# Patient Record
Sex: Male | Born: 1990 | Race: White | Hispanic: No | Smoking: Current every day smoker
Health system: Southern US, Community
[De-identification: ages and names within clinical notes are randomized; demographics above are authoritative.]

## PROBLEM LIST (undated history)

## (undated) HISTORY — PX: FINGER SURGERY: SHX640

---
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
## 2011-01-22 NOTE — Op Note (Signed)
NAMEJOEANTHONY, SEELING NO.:  [id number]   MEDICAL RECORD NO.:  [id number]          PATIENT TYPE:  INP   LOCATION:  2550                         FACILITY:  [facility]   PHYSICIAN:  Madelynn Done, MD  DATE OF BIRTH:  [birth year]   DATE OF PROCEDURE:  02/12/2009  DATE OF DISCHARGE:  02/12/2009                               OPERATIVE REPORT   PREOPERATIVE DIAGNOSIS:  Right long finger laceration with tendon and  nerve involvement.   POSTOPERATIVE DIAGNOSIS:  Right long finger laceration with tendon and  nerve involvement.   ATTENDING PHYSICIAN:  Madelynn Done, MD who was scrubbed and present  for the entire procedure.   ASSISTANT SURGEON:  None.   SURGICAL PROCEDURES:  1. Right long finger zone II flexor digitorum profundus tendon repair.  2. Right long finger flexor digitorum superficialis tendon repair.  3. Right long finger radial digital nerve repair.  4. Traumatic laceration 3 cm right long finger repair.   ANESTHESIA:  General via endotracheal tube.   TOURNIQUET TIME:  54 minutes with 250 mmHg.   INTRAOPERATIVE FINDINGS:  The patient had a complete laceration to the  FDP, the ulnar digital nerve was in continuity, the radial digital nerve  was not in continuity.  The patient did have 60% laceration to the FDS,  the entire ulnar slip of the FDS was lacerated, there was approximately  20% laceration in the radial slip to the FDS.   SURGICAL INDICATIONS:  Mr. Russett is an 20 year old gentleman who was at  home using a knife, the knife slipped and cut him at his nondominant  right long finger.  The patient was seen and evaluated in the emergency  department and was recommended to undergo the above procedure.  Risks,  benefits, and alternatives were discussed in detail with the patient and  a signed informed consent was obtained.  Risks include but are not  limited to bleeding, infection, damage to nearby nerves, arteries and  tendons, tendon rupture, loss  of motion in digits and need for further  surgical intervention.   DESCRIPTION OF PROCEDURE:  The patient was appropriately identified in  the preoperative holding area and mark with a permanent marker was made  on the right long finger indicating the correct operative site.  The  patient then brought back to the operating room, placed supine on  anesthesia table and general anesthesia was administered.  The patient  tolerated this well.  He received preoperative antibiotics prior to any  skin incisions.  A well-padded tourniquet was then placed on the right  brachium and sealed with a 1000 drape.  Right upper extremity was then  prepped with Hibiclens and sterilely draped.  A time-out was called,  correct side was identified and procedure was then begun.  Attention was  then turned to the right long finger where the limb was then elevated  using Esmarch exsanguination and tourniquet insufflated.  The oblique  laceration was then carried out proximally and extended proximally and  distally.  Skin flaps were then raised.  Careful attention was  maintained with continuity in the ulnar neurovascular bundle.  The  radial digital nerve was then sharply lacerated from its injury.  Dissection was carried out into the flexor tendon sheath.  The FDS and  FDP were both identified.  This was at the level of the A3 pulley just  in between the A3 and A4 pulley, A4 pulley was in continuity.  Following  this attention was then turned to repair the FDS.  The ulnar slip of the  FDS was then repaired with a 4 strand core horizontal mattress sutures  supplemented by 6-0 Prolene running epitendinous suture.  The radial  slip which had a 20% laceration was then repaired with a 4-0 core  FiberWire suture supplemented with a 6-0 Prolene epitendinous suture.  This reapproximated the tendon edges nicely.  Attention was then turned  to retrieval of the FDP.  The FDP was within the wound and not retracted   significantly.  The FDS was advanced, the FDP was then repaired using a  6 strand core suture repair or the modified Kessler 2 horizontal  mattress core sutures.  Following this, it was supplemented with a 6-0  epitendinous suture.  There was good repair across the laceration site.  Finally, attention was then turned to the radial digital nerve where the  nerve ends were then dissected carefully freeing.  The nerve ends were  clearly and sharply lacerated.  They were taken back to healthy nerve  fascicles sharply and then 9-0 epitendinous suture repair was then done  under loupe magnification.  There was a good reapproximation of the  nerve ends.  Following this the wound was then thoroughly irrigated.  The tourniquet was deflated.  Skin was then closed with simple 4-0  Prolene sutures.  Marcaine 0.25% 8 mL were then infiltrated flexor  tendon sheath block.  Adaptic dressing and sterile compressive bandages  were then applied.  The patient was then placed in a well-padded dorsal  blocking splint keeping the fingers in slight flexed position and the  wrist flexed.  He was then extubated and taken to recovery room in good  condition.   POSTOPERATIVE PLAN:  The patient will be discharged home and seen back  in the office in 5 days for wound check and begin a zone II flexor  tendon rehab protocol.      Madelynn Done, MD  Electronically Signed     FWO/MEDQ  D:  02/12/2009  T:  02/13/2009  Job:  [id number]

## 2012-04-17 ENCOUNTER — Encounter (HOSPITAL_BASED_OUTPATIENT_CLINIC_OR_DEPARTMENT_OTHER): Payer: Self-pay | Admitting: *Deleted

## 2012-04-17 ENCOUNTER — Emergency Department (HOSPITAL_BASED_OUTPATIENT_CLINIC_OR_DEPARTMENT_OTHER)
Admission: EM | Admit: 2012-04-17 | Discharge: 2012-04-17 | Disposition: A | Discharge status: Home / Self Care | Payer: Self-pay | Attending: Emergency Medicine | Admitting: Emergency Medicine

## 2012-04-17 DIAGNOSIS — Z789 Other specified health status: Secondary | ICD-10-CM

## 2012-04-17 DIAGNOSIS — F1010 Alcohol abuse, uncomplicated: Secondary | ICD-10-CM | POA: Insufficient documentation

## 2012-04-17 DIAGNOSIS — F17200 Nicotine dependence, unspecified, uncomplicated: Secondary | ICD-10-CM | POA: Insufficient documentation

## 2012-04-17 NOTE — ED Provider Notes (Signed)
History     CSN: [id number]  Arrival date & time 04/17/12  0719   First MD Initiated Contact with Patient 04/17/12 [phone]      Chief Complaint  Patient presents with  . Alcohol Intoxication    (Consider location/radiation/quality/duration/timing/severity/associated sxs/prior treatment) HPI This 21 year old male drinks beers his friends last night and walked home, as he was walking home he got tired and laid down on the side of the road to take a nap. EMS was notified and found him sleeping initially and had to wake him up with painful stimulus. Once the patient was awake he is now awake alert nontoxic with normal speech and gait and denying any complaints at all. He denies any trauma. Denies any threats or himself or others. He denies any headache neck pain back pain chest pain shortness breath abdominal pain weakness numbness or other concerns. He feels fine and just wants to call his sister for ride home. History reviewed. No pertinent past medical history.  Past Surgical History  Procedure Date  . Finger surgery     No family history on file.  History  Substance Use Topics  . Smoking status: Current Everyday Smoker -- 1.0 packs/day    Types: Cigarettes  . Smokeless tobacco: Not on file  . Alcohol Use: 3.0 oz/week    5 Cans of beer per week     daily      Review of Systems 10 Systems reviewed and are negative for acute change except as noted in the HPI. Allergies  Review of patient's allergies indicates no known allergies.  Home Medications  No current outpatient prescriptions on file.  BP 119/75  Pulse 88  Temp 98.3 F (36.8 C) (Oral)  Resp 20  Ht 5\' 11"  (1.803 m)  Wt 160 lb (72.576 kg)  BMI 22.32 kg/m2  SpO2 100%  Physical Exam  Nursing note and vitals reviewed. Constitutional: He is oriented to person, place, and time.       Awake, alert, nontoxic appearance with baseline speech for patient.  HENT:  Head: Atraumatic.  Mouth/Throat: Oropharynx is clear  and moist. No oropharyngeal exudate.  Eyes: EOM are normal. Pupils are equal, round, and reactive to light. Right eye exhibits no discharge. Left eye exhibits no discharge.  Neck: Neck supple.  Cardiovascular: Normal rate and regular rhythm.   No murmur heard. Pulmonary/Chest: Effort normal and breath sounds normal. No stridor. No respiratory distress. He has no wheezes. He has no rales. He exhibits no tenderness.  Abdominal: Soft. Bowel sounds are normal. He exhibits no mass. There is no tenderness. There is no rebound.  Musculoskeletal: He exhibits no tenderness.       Baseline ROM, moves extremities with no obvious new focal weakness.  Lymphadenopathy:    He has no cervical adenopathy.  Neurological: He is alert and oriented to person, place, and time.       Awake, alert, cooperative and aware of situation; motor strength bilaterally; sensation normal to light touch bilaterally; peripheral visual fields full to confrontation; no facial asymmetry; tongue midline; major cranial nerves appear intact; no pronator drift, normal finger to nose bilaterally, baseline gait without new ataxia.  Skin: No rash noted.  Psychiatric: He has a normal mood and affect.    ED Course  Procedures (including critical care time)  Labs Reviewed - No data to display No results found.   1. Alcohol ingestion       MDM  Doubt intentional OD, SI, or ongoing clinical intoxication.  Hurman Horn, MD 04/18/12 [phone]

## 2012-04-17 NOTE — ED Notes (Signed)
EMS found on side of road, unconscious except to painful stimulation with a case of beer beside him.  VSS.  Patient is now awake, alert and oriented x 4.  Denies pain.  MOE x 4.  Full immobilized.

## 2020-06-11 ENCOUNTER — Emergency Department (HOSPITAL_BASED_OUTPATIENT_CLINIC_OR_DEPARTMENT_OTHER): Payer: Self-pay

## 2020-06-11 ENCOUNTER — Other Ambulatory Visit: Payer: Self-pay

## 2020-06-11 ENCOUNTER — Emergency Department (HOSPITAL_BASED_OUTPATIENT_CLINIC_OR_DEPARTMENT_OTHER)
Admission: EM | Admit: 2020-06-11 | Discharge: 2020-06-11 | Disposition: A | Discharge status: Home / Self Care | Payer: Self-pay | Attending: Emergency Medicine | Admitting: Emergency Medicine

## 2020-06-11 ENCOUNTER — Encounter (HOSPITAL_BASED_OUTPATIENT_CLINIC_OR_DEPARTMENT_OTHER): Payer: Self-pay | Admitting: Emergency Medicine

## 2020-06-11 DIAGNOSIS — Z23 Encounter for immunization: Secondary | ICD-10-CM | POA: Insufficient documentation

## 2020-06-11 DIAGNOSIS — R10819 Abdominal tenderness, unspecified site: Secondary | ICD-10-CM | POA: Insufficient documentation

## 2020-06-11 DIAGNOSIS — Y92410 Unspecified street and highway as the place of occurrence of the external cause: Secondary | ICD-10-CM | POA: Insufficient documentation

## 2020-06-11 DIAGNOSIS — S92331A Displaced fracture of third metatarsal bone, right foot, initial encounter for closed fracture: Secondary | ICD-10-CM

## 2020-06-11 DIAGNOSIS — S0101XA Laceration without foreign body of scalp, initial encounter: Secondary | ICD-10-CM | POA: Insufficient documentation

## 2020-06-11 DIAGNOSIS — S92341A Displaced fracture of fourth metatarsal bone, right foot, initial encounter for closed fracture: Secondary | ICD-10-CM

## 2020-06-11 DIAGNOSIS — S82831A Other fracture of upper and lower end of right fibula, initial encounter for closed fracture: Secondary | ICD-10-CM

## 2020-06-11 DIAGNOSIS — Y9389 Activity, other specified: Secondary | ICD-10-CM | POA: Insufficient documentation

## 2020-06-11 DIAGNOSIS — S20211A Contusion of right front wall of thorax, initial encounter: Secondary | ICD-10-CM | POA: Insufficient documentation

## 2020-06-11 DIAGNOSIS — F17210 Nicotine dependence, cigarettes, uncomplicated: Secondary | ICD-10-CM | POA: Insufficient documentation

## 2020-06-11 LAB — COMPREHENSIVE METABOLIC PANEL
ALT: 54 U/L — ABNORMAL HIGH (ref 0–44)
AST: 108 U/L — ABNORMAL HIGH (ref 15–41)
Albumin: 4.5 g/dL (ref 3.5–5.0)
Alkaline Phosphatase: 43 U/L (ref 38–126)
Anion gap: 11 (ref 5–15)
BUN: 11 mg/dL (ref 6–20)
CO2: 25 mmol/L (ref 22–32)
Calcium: 9.1 mg/dL (ref 8.9–10.3)
Chloride: 102 mmol/L (ref 98–111)
Creatinine, Ser: 0.61 mg/dL (ref 0.61–1.24)
GFR calc Af Amer: >60 mL/min (ref >60)
GFR calc non Af Amer: >60 mL/min (ref >60)
Glucose, Bld: 115 mg/dL — ABNORMAL HIGH (ref 70–99)
Potassium: 4 mmol/L (ref 3.5–5.1)
Sodium: 138 mmol/L (ref 135–145)
Total Bilirubin: 0.7 mg/dL (ref 0.3–1.2)
Total Protein: 7.1 g/dL (ref 6.5–8.1)

## 2020-06-11 LAB — CBC
HCT: 40.6 % (ref 39.0–52.0)
Hemoglobin: 14 g/dL (ref 13.0–17.0)
MCH: 32.6 pg (ref 26.0–34.0)
MCHC: 34.5 g/dL (ref 30.0–36.0)
MCV: 94.4 fL (ref 80.0–100.0)
Platelets: 229 10*3/uL (ref 150–400)
RBC: 4.3 MIL/uL (ref 4.22–5.81)
RDW: 11.9 % (ref 11.5–15.5)
WBC: 12.2 10*3/uL — ABNORMAL HIGH (ref 4.0–10.5)
nRBC: 0 % (ref 0.0–0.2)

## 2020-06-11 LAB — PROTIME-INR
INR: 1 (ref 0.8–1.2)
Prothrombin Time: 13 seconds (ref 11.4–15.2)

## 2020-06-11 LAB — LACTIC ACID, PLASMA: Lactic Acid, Venous: 1.8 mmol/L (ref 0.5–1.9)

## 2020-06-11 MED ORDER — TETANUS-DIPHTH-ACELL PERTUSSIS 5-2.5-18.5 LF-MCG/0.5 IM SUSP
0.5000 mL | Freq: Once | INTRAMUSCULAR | Status: AC
Start: 2020-06-11 — End: 2020-06-11
  Administered 2020-06-11: 0.5 mL via INTRAMUSCULAR
  Filled 2020-06-11: qty 0.5

## 2020-06-11 MED ORDER — MORPHINE SULFATE (PF) 4 MG/ML IV SOLN
4.0000 mg | Freq: Once | INTRAVENOUS | Status: AC
  Administered 2020-06-11: 4 mg via INTRAVENOUS
  Filled 2020-06-11: qty 1

## 2020-06-11 MED ORDER — SODIUM CHLORIDE 0.9 % IV SOLN: INTRAVENOUS | Status: DC

## 2020-06-11 MED ORDER — IOHEXOL 300 MG/ML  SOLN
100.0000 mL | Freq: Once | INTRAMUSCULAR | Status: AC | PRN
  Administered 2020-06-11: 100 mL via INTRAVENOUS

## 2020-06-11 MED ORDER — SODIUM CHLORIDE 0.9 % IV BOLUS
1000.0000 mL | Freq: Once | INTRAVENOUS | Status: AC
  Administered 2020-06-11: 1000 mL via INTRAVENOUS

## 2020-06-11 MED ORDER — HYDROCODONE-ACETAMINOPHEN 5-325 MG PO TABS: 1.0000 | ORAL_TABLET | ORAL | 0 refills | Status: DC | PRN

## 2020-06-11 MED ORDER — ONDANSETRON HCL 4 MG/2ML IJ SOLN
4.0000 mg | Freq: Once | INTRAMUSCULAR | Status: AC
  Administered 2020-06-11: 4 mg via INTRAVENOUS
  Filled 2020-06-11: qty 2

## 2020-06-11 MED ORDER — CYCLOBENZAPRINE HCL 10 MG PO TABS: 10.0000 mg | ORAL_TABLET | Freq: Two times a day (BID) | ORAL | 0 refills | Status: AC | PRN

## 2020-06-11 NOTE — ED Notes (Signed)
Resting quietly, appears comfortable at this time.
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
## 2020-06-11 NOTE — ED Notes (Signed)
RLE/ Ankle/ Foot splint applied by EMT, cast/ splint instructions reviewed with pt by RN. Crutch teaching also provided. Good Capillary Refill, WNL noted post splint cast application of rt toes. Able to move toes well, also denies any numbness or tingling of rt toes as well. Copy of AVS provided to pt as well

## 2020-06-11 NOTE — Discharge Instructions (Addendum)
You need to follow-up with orthopedic surgery within the week.  Their contact numbers listed on your discharge paperwork.  Do not attempt to walk on your foot.  Please keep the splint dry.  Staples need to be removed in 7 to 10 days  Take the pain medicine as prescribed.  Return for new or worsening symptoms.

## 2020-06-11 NOTE — ED Notes (Signed)
In at bedside to clean up scalp area, medical cleansing spray utilized, tolerated very well
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
## 2020-06-11 NOTE — ED Notes (Signed)
Patient transported to CT 

## 2020-06-11 NOTE — ED Notes (Signed)
Pt teaching also provided on wound care of abrasions and head lac where staples have been placed. Discussed with significant other when there would be a need to return to the ED. Pt escorted to POV via wc and assisted into POV

## 2020-06-11 NOTE — ED Notes (Signed)
Medicated per ED PA for manipulantion and splinting of Rt ankle area.

## 2020-06-11 NOTE — ED Notes (Signed)
A = WNL B = WNL C = bruising, abrasions, good pulses noted, bleeding is controlled D = GCS 15, RASS 0, WNL E = as noted in previous note F = vital signs as noted G = on cardiac monitor, position of comfort H = performed with ED PA I = clear, no injuries or discoloration noted
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
## 2020-06-11 NOTE — ED Notes (Addendum)
Parietal area of scalp, 2 lacerations noted: 1cm straight, 2cm crecent shaped, bleeding controlled Rt hand appears swollen and is tender to touch Rt side of abdomen has large discoloration with abrasion noted Rt hip has large bruise area Rt knee, lateral aspect has abrasion noted as well Rt foot, very swollen, bruising medial and lateral, very tender to touch Large abrasion noted over left eye also noted

## 2020-06-16 ENCOUNTER — Encounter (HOSPITAL_BASED_OUTPATIENT_CLINIC_OR_DEPARTMENT_OTHER): Payer: Self-pay | Admitting: Orthopedic Surgery

## 2020-06-16 ENCOUNTER — Other Ambulatory Visit: Payer: Self-pay

## 2020-06-20 ENCOUNTER — Other Ambulatory Visit (HOSPITAL_COMMUNITY)
Admission: RE | Admit: 2020-06-20 | Discharge: 2020-06-20 | Disposition: A | Discharge status: Home / Self Care | Payer: HRSA Program | Source: Ambulatory Visit | Attending: Orthopedic Surgery | Admitting: Orthopedic Surgery

## 2020-06-20 DIAGNOSIS — Z20822 Contact with and (suspected) exposure to covid-19: Secondary | ICD-10-CM | POA: Insufficient documentation

## 2020-06-20 DIAGNOSIS — Z01812 Encounter for preprocedural laboratory examination: Secondary | ICD-10-CM | POA: Insufficient documentation

## 2020-06-20 LAB — SARS CORONAVIRUS 2 (TAT 6-24 HRS): SARS Coronavirus 2: NEGATIVE

## 2020-06-20 MED ORDER — IOHEXOL 350 MG/ML SOLN
INTRAVENOUS | Status: AC
  Filled 2020-06-20: qty 1

## 2020-06-20 MED ORDER — LIDOCAINE HCL (PF) 1 % IJ SOLN
INTRAMUSCULAR | Status: AC
  Filled 2020-06-20: qty 30

## 2020-06-20 MED ORDER — HEPARIN (PORCINE) IN NACL 1000-0.9 UT/500ML-% IV SOLN
INTRAVENOUS | Status: AC
  Filled 2020-06-20: qty 1000

## 2020-06-20 MED ORDER — NITROGLYCERIN 1 MG/10 ML FOR IR/CATH LAB
INTRA_ARTERIAL | Status: AC
  Filled 2020-06-20: qty 10
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
## 2020-06-23 ENCOUNTER — Ambulatory Visit (HOSPITAL_BASED_OUTPATIENT_CLINIC_OR_DEPARTMENT_OTHER)
Admission: RE | Admit: 2020-06-23 | Discharge: 2020-06-23 | Disposition: A | Discharge status: Home / Self Care | Payer: Self-pay | Attending: Orthopedic Surgery | Admitting: Orthopedic Surgery

## 2020-06-23 ENCOUNTER — Other Ambulatory Visit: Payer: Self-pay

## 2020-06-23 ENCOUNTER — Encounter (HOSPITAL_BASED_OUTPATIENT_CLINIC_OR_DEPARTMENT_OTHER): Payer: Self-pay | Admitting: Orthopedic Surgery

## 2020-06-23 ENCOUNTER — Ambulatory Visit (HOSPITAL_BASED_OUTPATIENT_CLINIC_OR_DEPARTMENT_OTHER): Payer: Self-pay | Admitting: Certified Registered"

## 2020-06-23 ENCOUNTER — Encounter (HOSPITAL_BASED_OUTPATIENT_CLINIC_OR_DEPARTMENT_OTHER)
Admission: RE | Disposition: A | Discharge status: Home / Self Care | Payer: Self-pay | Source: Home / Self Care | Attending: Orthopedic Surgery

## 2020-06-23 DIAGNOSIS — S93401A Sprain of unspecified ligament of right ankle, initial encounter: Secondary | ICD-10-CM | POA: Insufficient documentation

## 2020-06-23 DIAGNOSIS — S82851A Displaced trimalleolar fracture of right lower leg, initial encounter for closed fracture: Secondary | ICD-10-CM | POA: Insufficient documentation

## 2020-06-23 DIAGNOSIS — F17200 Nicotine dependence, unspecified, uncomplicated: Secondary | ICD-10-CM | POA: Insufficient documentation

## 2020-06-23 HISTORY — PX: ORIF ANKLE FRACTURE: SHX5408

## 2020-06-23 SURGERY — OPEN REDUCTION INTERNAL FIXATION (ORIF) ANKLE FRACTURE
Anesthesia: Regional | Site: Ankle | Laterality: Right

## 2020-06-23 MED ORDER — FENTANYL CITRATE (PF) 100 MCG/2ML IJ SOLN
INTRAMUSCULAR | Status: AC
  Filled 2020-06-23: qty 2

## 2020-06-23 MED ORDER — FENTANYL CITRATE (PF) 100 MCG/2ML IJ SOLN
INTRAMUSCULAR | Status: DC | PRN
Start: 2020-06-23 — End: 2020-06-23
  Administered 2020-06-23: 25 ug via INTRAVENOUS

## 2020-06-23 MED ORDER — FENTANYL CITRATE (PF) 100 MCG/2ML IJ SOLN
100.0000 ug | Freq: Once | INTRAMUSCULAR | Status: AC
  Administered 2020-06-23: 50 ug via INTRAVENOUS

## 2020-06-23 MED ORDER — OXYCODONE HCL 5 MG PO TABS: 10.0000 mg | ORAL_TABLET | ORAL | Status: DC | PRN

## 2020-06-23 MED ORDER — LIDOCAINE HCL (CARDIAC) PF 100 MG/5ML IV SOSY
PREFILLED_SYRINGE | INTRAVENOUS | Status: DC | PRN
  Administered 2020-06-23: 60 mg via INTRAVENOUS

## 2020-06-23 MED ORDER — CEFAZOLIN SODIUM-DEXTROSE 2-4 GM/100ML-% IV SOLN
INTRAVENOUS | Status: AC
  Filled 2020-06-23: qty 100

## 2020-06-23 MED ORDER — PHENYLEPHRINE HCL (PRESSORS) 10 MG/ML IV SOLN
INTRAVENOUS | Status: DC | PRN
  Administered 2020-06-23: 40 ug via INTRAVENOUS

## 2020-06-23 MED ORDER — ONDANSETRON HCL 4 MG/2ML IJ SOLN
INTRAMUSCULAR | Status: DC | PRN
  Administered 2020-06-23: 4 mg via INTRAVENOUS

## 2020-06-23 MED ORDER — DEXAMETHASONE SODIUM PHOSPHATE 10 MG/ML IJ SOLN
INTRAMUSCULAR | Status: DC | PRN
  Administered 2020-06-23: 5 mg via INTRAVENOUS

## 2020-06-23 MED ORDER — BISACODYL 10 MG RE SUPP: 10.0000 mg | Freq: Every day | RECTAL | Status: DC | PRN

## 2020-06-23 MED ORDER — OXYCODONE HCL 5 MG PO TABS: 5.0000 mg | ORAL_TABLET | ORAL | Status: DC | PRN

## 2020-06-23 MED ORDER — ACETAMINOPHEN 325 MG PO TABS: 325.0000 mg | ORAL_TABLET | Freq: Four times a day (QID) | ORAL | Status: DC | PRN

## 2020-06-23 MED ORDER — ONDANSETRON HCL 4 MG PO TABS: 4.0000 mg | ORAL_TABLET | Freq: Four times a day (QID) | ORAL | Status: DC | PRN

## 2020-06-23 MED ORDER — METOCLOPRAMIDE HCL 5 MG PO TABS: 5.0000 mg | ORAL_TABLET | Freq: Three times a day (TID) | ORAL | Status: DC | PRN

## 2020-06-23 MED ORDER — OXYCODONE HCL 5 MG/5ML PO SOLN: 5.0000 mg | Freq: Once | ORAL | Status: DC | PRN

## 2020-06-23 MED ORDER — ONDANSETRON HCL 4 MG/2ML IJ SOLN: 4.0000 mg | Freq: Four times a day (QID) | INTRAMUSCULAR | Status: DC | PRN

## 2020-06-23 MED ORDER — METOCLOPRAMIDE HCL 5 MG/ML IJ SOLN: 5.0000 mg | Freq: Three times a day (TID) | INTRAMUSCULAR | Status: DC | PRN

## 2020-06-23 MED ORDER — OXYCODONE HCL 5 MG PO TABS: 5.0000 mg | ORAL_TABLET | Freq: Once | ORAL | Status: DC | PRN

## 2020-06-23 MED ORDER — ONDANSETRON HCL 4 MG/2ML IJ SOLN: 4.0000 mg | Freq: Once | INTRAMUSCULAR | Status: DC | PRN

## 2020-06-23 MED ORDER — ACETAMINOPHEN 325 MG PO TABS: 325.0000 mg | ORAL_TABLET | ORAL | Status: DC | PRN

## 2020-06-23 MED ORDER — HYDROMORPHONE HCL 1 MG/ML IJ SOLN: 0.5000 mg | INTRAMUSCULAR | Status: DC | PRN

## 2020-06-23 MED ORDER — CEFAZOLIN SODIUM-DEXTROSE 2-4 GM/100ML-% IV SOLN: 2.0000 g | Freq: Four times a day (QID) | INTRAVENOUS | Status: DC

## 2020-06-23 MED ORDER — ACETAMINOPHEN 160 MG/5ML PO SOLN: 325.0000 mg | ORAL | Status: DC | PRN

## 2020-06-23 MED ORDER — ROPIVACAINE HCL 5 MG/ML IJ SOLN
INTRAMUSCULAR | Status: DC | PRN
  Administered 2020-06-23: 30 mL via PERINEURAL

## 2020-06-23 MED ORDER — POLYETHYLENE GLYCOL 3350 17 G PO PACK: 17.0000 g | PACK | Freq: Every day | ORAL | Status: DC | PRN

## 2020-06-23 MED ORDER — HYDROCODONE-ACETAMINOPHEN 5-325 MG PO TABS: 1.0000 | ORAL_TABLET | ORAL | 0 refills | Status: AC | PRN

## 2020-06-23 MED ORDER — MIDAZOLAM HCL 2 MG/2ML IJ SOLN
INTRAMUSCULAR | Status: AC
  Filled 2020-06-23: qty 2

## 2020-06-23 MED ORDER — LACTATED RINGERS IV SOLN: INTRAVENOUS | Status: DC

## 2020-06-23 MED ORDER — KETOROLAC TROMETHAMINE 15 MG/ML IJ SOLN: 15.0000 mg | Freq: Four times a day (QID) | INTRAMUSCULAR | Status: DC

## 2020-06-23 MED ORDER — PROPOFOL 10 MG/ML IV BOLUS
INTRAVENOUS | Status: DC | PRN
  Administered 2020-06-23: 150 mg via INTRAVENOUS
  Administered 2020-06-23: 30 mg via INTRAVENOUS

## 2020-06-23 MED ORDER — MIDAZOLAM HCL 2 MG/2ML IJ SOLN
2.0000 mg | Freq: Once | INTRAMUSCULAR | Status: AC
  Administered 2020-06-23: 2 mg via INTRAVENOUS

## 2020-06-23 MED ORDER — HYDROMORPHONE HCL 1 MG/ML IJ SOLN: 0.2500 mg | INTRAMUSCULAR | Status: DC | PRN

## 2020-06-23 MED ORDER — CEFAZOLIN SODIUM-DEXTROSE 2-4 GM/100ML-% IV SOLN
2.0000 g | INTRAVENOUS | Status: AC
  Administered 2020-06-23: 2 g via INTRAVENOUS

## 2020-06-23 SURGICAL SUPPLY — 77 items
APL PRP STRL LF DISP 70% ISPRP (MISCELLANEOUS) ×1
BANDAGE ESMARK 6X9 LF (GAUZE/BANDAGES/DRESSINGS) ×1 IMPLANT
BIT DRILL 2.5X125 (BIT) ×2 IMPLANT
BIT DRILL 3.5X125 (BIT) ×1 IMPLANT
BLADE SURG 15 STRL LF DISP TIS (BLADE) ×2 IMPLANT
BLADE SURG 15 STRL SS (BLADE) ×4
BNDG CMPR 9X6 STRL LF SNTH (GAUZE/BANDAGES/DRESSINGS) ×1
BNDG COHESIVE 4X5 TAN STRL (GAUZE/BANDAGES/DRESSINGS) ×2 IMPLANT
BNDG ELASTIC 4X5.8 VLCR STR LF (GAUZE/BANDAGES/DRESSINGS) ×2 IMPLANT
BNDG ELASTIC 6X5.8 VLCR STR LF (GAUZE/BANDAGES/DRESSINGS) ×2 IMPLANT
BNDG ESMARK 6X9 LF (GAUZE/BANDAGES/DRESSINGS) ×2
CHLORAPREP W/TINT 26 (MISCELLANEOUS) ×2 IMPLANT
CLSR STERI-STRIP ANTIMIC 1/2X4 (GAUZE/BANDAGES/DRESSINGS) ×2 IMPLANT
COVER BACK TABLE 60X90IN (DRAPES) ×2 IMPLANT
COVER WAND RF STERILE (DRAPES) IMPLANT
CUFF TOURN SGL QUICK 24 (TOURNIQUET CUFF)
CUFF TOURN SGL QUICK 34 (TOURNIQUET CUFF)
CUFF TRNQT CYL 24X4X16.5-23 (TOURNIQUET CUFF) IMPLANT
CUFF TRNQT CYL 34X4.125X (TOURNIQUET CUFF) IMPLANT
DECANTER SPIKE VIAL GLASS SM (MISCELLANEOUS) IMPLANT
DRAPE EXTREMITY T 121X128X90 (DISPOSABLE) ×2 IMPLANT
DRAPE IMP U-DRAPE 54X76 (DRAPES) ×2 IMPLANT
DRAPE OEC MINIVIEW 54X84 (DRAPES) ×2 IMPLANT
DRAPE U-SHAPE 47X51 STRL (DRAPES) ×2 IMPLANT
DRILL BIT 3.5X125 (BIT) ×2
DRSG EMULSION OIL 3X3 NADH (GAUZE/BANDAGES/DRESSINGS) ×2 IMPLANT
DRSG PAD ABDOMINAL 8X10 ST (GAUZE/BANDAGES/DRESSINGS) ×2 IMPLANT
ELECT REM PT RETURN 9FT ADLT (ELECTROSURGICAL) ×2
ELECTRODE REM PT RTRN 9FT ADLT (ELECTROSURGICAL) ×1 IMPLANT
GAUZE SPONGE 4X4 12PLY STRL (GAUZE/BANDAGES/DRESSINGS) ×2 IMPLANT
GLOVE BIO SURGEON STRL SZ7.5 (GLOVE) ×4 IMPLANT
GLOVE BIOGEL PI IND STRL 7.0 (GLOVE) ×1 IMPLANT
GLOVE BIOGEL PI IND STRL 8 (GLOVE) ×2 IMPLANT
GLOVE BIOGEL PI INDICATOR 7.0 (GLOVE) ×1
GLOVE BIOGEL PI INDICATOR 8 (GLOVE) ×2
GLOVE ECLIPSE 6.5 STRL STRAW (GLOVE) ×2 IMPLANT
GOWN STRL REUS W/ TWL LRG LVL3 (GOWN DISPOSABLE) ×2 IMPLANT
GOWN STRL REUS W/ TWL XL LVL3 (GOWN DISPOSABLE) ×1 IMPLANT
GOWN STRL REUS W/TWL LRG LVL3 (GOWN DISPOSABLE) ×4
GOWN STRL REUS W/TWL XL LVL3 (GOWN DISPOSABLE) ×2
IMPL TIGHTROP W/DRV K-LESS (Anchor) ×1 IMPLANT
IMPLANT TIGHTROPE W/DRV K-LESS (Anchor) ×2 IMPLANT
NEEDLE HYPO 22GX1.5 SAFETY (NEEDLE) IMPLANT
NS IRRIG 1000ML POUR BTL (IV SOLUTION) ×2 IMPLANT
PACK BASIN DAY SURGERY FS (CUSTOM PROCEDURE TRAY) ×2 IMPLANT
PAD CAST 4YDX4 CTTN HI CHSV (CAST SUPPLIES) ×1 IMPLANT
PADDING CAST ABS 4INX4YD NS (CAST SUPPLIES) ×2
PADDING CAST ABS COTTON 4X4 ST (CAST SUPPLIES) ×2 IMPLANT
PADDING CAST COTTON 4X4 STRL (CAST SUPPLIES) ×2
PADDING CAST COTTON 6X4 STRL (CAST SUPPLIES) ×2 IMPLANT
PENCIL SMOKE EVACUATOR (MISCELLANEOUS) ×2 IMPLANT
PLATE 1/3 TUBULAR 7H (Plate) ×2 IMPLANT
SCREW CANC FT 16X4X2.5XHEX (Screw) ×2 IMPLANT
SCREW CANCELLOUS 4.0X14 (Screw) ×4 IMPLANT
SCREW CANCELLOUS 4.0X16MM (Screw) ×4 IMPLANT
SCREW CORTEX ST MATTA 3.5X14 (Screw) ×2 IMPLANT
SCREW CORTEX ST MATTA 3.5X16MM (Screw) ×4 IMPLANT
SCREW CORTEX ST MATTA 3.5X24 (Screw) ×2 IMPLANT
SLEEVE SCD COMPRESS KNEE MED (MISCELLANEOUS) ×2 IMPLANT
SPLINT FAST PLASTER 5X30 (CAST SUPPLIES) ×20
SPLINT PLASTER CAST FAST 5X30 (CAST SUPPLIES) ×20 IMPLANT
SPONGE LAP 4X18 RFD (DISPOSABLE) ×2 IMPLANT
SUCTION FRAZIER HANDLE 10FR (MISCELLANEOUS) ×1
SUCTION TUBE FRAZIER 10FR DISP (MISCELLANEOUS) ×1 IMPLANT
SUT ETHILON 3 0 PS 1 (SUTURE) IMPLANT
SUT MNCRL AB 4-0 PS2 18 (SUTURE) ×2 IMPLANT
SUT MON AB 2-0 CT1 36 (SUTURE) IMPLANT
SUT MON AB 3-0 SH 27 (SUTURE)
SUT MON AB 3-0 SH27 (SUTURE) IMPLANT
SUT VIC AB 0 SH 27 (SUTURE) ×2 IMPLANT
SUT VIC AB 2-0 SH 27 (SUTURE)
SUT VIC AB 2-0 SH 27XBRD (SUTURE) IMPLANT
SYR BULB EAR ULCER 3OZ GRN STR (SYRINGE) ×2 IMPLANT
SYR CONTROL 10ML LL (SYRINGE) IMPLANT
TOWEL GREEN STERILE FF (TOWEL DISPOSABLE) ×4 IMPLANT
TUBE CONNECTING 20X1/4 (TUBING) ×2 IMPLANT
UNDERPAD 30X36 HEAVY ABSORB (UNDERPADS AND DIAPERS) ×2 IMPLANT

## 2020-06-23 NOTE — Discharge Instructions (Signed)
It is very important for you to Elevate your leg - Toes above nose as much as possible to reduce pain / swelling. If needed, you may increase pain medication for the first few days post op to 2 tablets every 4 hours.  Weight Bearing:  Non weight bearing affected leg.  Diet: As you were doing prior to hospitalization   Shower:  You have a splint on, leave the splint in place and keep the splint dry with a plastic bag.  Dressing:  You have a splint. Leave the splint in place and we will change your bandages during your first follow-up appointment.  You may loosen and re-apply ace wrap if it feels too tight.    Activity:  Increase activity slowly as tolerated, but follow the weight bearing instructions below.  The rules on driving is that you can not be taking narcotics while you drive, and you must feel in control of the vehicle.    To prevent constipation:  Narcotic medicines cause constipation.  Wean these as soon as is appropriate.   You may use a stool softener such as -  Colace (over the counter) 100 mg by mouth twice a day  Drink plenty of fluids (prune juice may be helpful) and high fiber foods Miralax (over the counter) for constipation as needed.    Itching:  If you experience itching with your medications, try taking only a single pain pill, or even half a pain pill at a time.  You can also use benadryl over the counter for itching or also to help with sleep.   Precautions:  If you experience chest pain or shortness of breath - call 911 immediately for transfer to the hospital emergency department!!  If you develop a fever greater that 101 F, purulent drainage from wound, increased redness or drainage from wound, or calf pain -- Call the office at [phone]                                                 Follow- Up Appointment:  Please call for an appointment to be seen in 1-2 weeks [place] - [phone]     Post Anesthesia Home Care Instructions  Activity: Get  plenty of rest for the remainder of the day. A responsible individual must stay with you for 24 hours following the procedure.  For the next 24 hours, DO NOT: -Drive a car -Advertising copywriter -Drink alcoholic beverages -Take any medication unless instructed by your physician -Make any legal decisions or sign important papers.  Meals: Start with liquid foods such as gelatin or soup. Progress to regular foods as tolerated. Avoid greasy, spicy, heavy foods. If nausea and/or vomiting occur, drink only clear liquids until the nausea and/or vomiting subsides. Call your physician if vomiting continues.  Special Instructions/Symptoms: Your throat may feel dry or sore from the anesthesia or the breathing tube placed in your throat during surgery. If this causes discomfort, gargle with warm salt water. The discomfort should disappear within 24 hours.  If you had a scopolamine patch placed behind your ear for the management of post- operative nausea and/or vomiting:  1. The medication in the patch is effective for 72 hours, after which it should be removed.  Wrap patch in a tissue and discard in the trash. Wash hands thoroughly with soap and water. 2. You may remove  the patch earlier than 72 hours if you experience unpleasant side effects which may include dry mouth, dizziness or visual disturbances. 3. Avoid touching the patch. Wash your hands with soap and water after contact with the patch.       Regional Anesthesia Blocks  1. Numbness or the inability to move the "blocked" extremity may last from 3-48 hours after placement. The length of time depends on the medication injected and your individual response to the medication. If the numbness is not going away after 48 hours, call your surgeon.  2. The extremity that is blocked will need to be protected until the numbness is gone and the  Strength has returned. Because you cannot feel it, you will need to take extra care to avoid injury. Because it  may be weak, you may have difficulty moving it or using it. You may not know what position it is in without looking at it while the block is in effect.  3. For blocks in the legs and feet, returning to weight bearing and walking needs to be done carefully. You will need to wait until the numbness is entirely gone and the strength has returned. You should be able to move your leg and foot normally before you try and bear weight or walk. You will need someone to be with you when you first try to ensure you do not fall and possibly risk injury.  4. Bruising and tenderness at the needle site are common side effects and will resolve in a few days.  5. Persistent numbness or new problems with movement should be communicated to the surgeon or the [facility] [phone] [facility] [phone]).

## 2020-06-23 NOTE — Anesthesia Procedure Notes (Signed)
Anesthesia Regional Block: Popliteal block   Pre-Anesthetic Checklist: ,, timeout performed, Correct Patient, Correct Site, Correct Laterality, Correct Procedure, Correct Position, site marked, Risks and benefits discussed,  Surgical consent,  Pre-op evaluation,  At surgeon's request and post-op pain management  Laterality: Right  Prep: Dura Prep       Needles:  Injection technique: Single-shot  Needle Type: Echogenic Stimulator Needle     Needle Length: 9cm  Needle Gauge: 20     Additional Needles:   Procedures:,,,, ultrasound used (permanent image in chart),,,,  Narrative:  Start time: 06/23/2020 1:16 PM End time: 06/23/2020 1:18 PM Injection made incrementally with aspirations every 5 mL.  Performed by: Personally  Anesthesiologist: Atilano Median, DO  Additional Notes: Patient identified. Risks/Benefits/Options discussed with patient including but not limited to bleeding, infection, nerve damage, failed block, incomplete pain control. Patient expressed understanding and wished to proceed. All questions were answered. Sterile technique was used throughout the entire procedure. Please see nursing notes for vital signs. Aspirated in 5cc intervals with injection for negative confirmation. Patient was given instructions on fall risk and not to get out of bed. All questions and concerns addressed with instructions to call with any issues or inadequate analgesia.

## 2020-06-23 NOTE — Anesthesia Preprocedure Evaluation (Addendum)
Anesthesia Evaluation  Patient identified by MRN, date of birth, ID band Patient awake    Reviewed: Patient's Chart, lab work & pertinent test results  Airway Mallampati: I  TM Distance: >3 FB Neck ROM: Full    Dental  (+) Teeth Intact   Pulmonary Current Smoker and Patient abstained from smoking.,    Pulmonary exam normal        Cardiovascular negative cardio ROS   Rhythm:Regular Rate:Normal     Neuro/Psych    GI/Hepatic negative GI ROS, Neg liver ROS,   Endo/Other  negative endocrine ROS  Renal/GU negative Renal ROS     Musculoskeletal Right ankle fracture   Abdominal (+)  Abdomen: soft. Bowel sounds: normal.  Peds  Hematology negative hematology ROS (+)   Anesthesia Other Findings   Reproductive/Obstetrics                            Anesthesia Physical Anesthesia Plan  ASA: II  Anesthesia Plan: General and Regional   Post-op Pain Management:  Regional for Post-op pain   Induction: Intravenous  PONV Risk Score and Plan: Ondansetron, Dexamethasone, Midazolam and Treatment may vary due to age or medical condition  Airway Management Planned: Mask and LMA  Additional Equipment: None  Intra-op Plan:   Post-operative Plan: Extubation in OR  Informed Consent: I have reviewed the patients History and Physical, chart, labs and discussed the procedure including the risks, benefits and alternatives for the proposed anesthesia with the patient or authorized representative who has indicated his/her understanding and acceptance.     Dental advisory given  Plan Discussed with: CRNA and Surgeon  Anesthesia Plan Comments:         Anesthesia Quick Evaluation

## 2020-06-23 NOTE — Anesthesia Postprocedure Evaluation (Signed)
Anesthesia Post Note  Patient: Leonard Gutierrez  Procedure(s) Performed: OPEN REDUCTION INTERNAL FIXATION (ORIF) ANKLE FRACTURE (Right Ankle)     Patient location during evaluation: PACU Anesthesia Type: Regional and General Level of consciousness: awake and alert Pain management: pain level controlled Vital Signs Assessment: post-procedure vital signs reviewed and stable Respiratory status: spontaneous breathing, nonlabored ventilation, respiratory function stable and patient connected to nasal cannula oxygen Cardiovascular status: blood pressure returned to baseline and stable Postop Assessment: no apparent nausea or vomiting Anesthetic complications: no   No complications documented.  Last Vitals:  Vitals:   06/23/20 1526 06/23/20 1604  BP: 117/80 (!) 129/91  Pulse: 90 92  Resp: 14 18  Temp: 36.4 C 36.4 C  SpO2: 100% 100%    Last Pain:  Vitals:   06/23/20 1604  TempSrc:   PainSc: 0-No pain                 Earl Lites P Kyler Lerette

## 2020-06-23 NOTE — Op Note (Signed)
06/23/2020  2:50 PM  PATIENT:  Leonard Gutierrez    PRE-OPERATIVE DIAGNOSIS:  right ankle fracture  POST-OPERATIVE DIAGNOSIS:  Same  PROCEDURE:  OPEN REDUCTION INTERNAL FIXATION (ORIF) ANKLE FRACTURE  SURGEON:  Sheral Apley, MD  ASSISTANT: Daun Peacock, PA-C, he was present and scrubbed throughout the case, critical for completion in a timely fashion, and for retraction, instrumentation, and closure.   ANESTHESIA:   gen   PREOPERATIVE INDICATIONS:  Leonard Gutierrez is a  29 y.o. male with a diagnosis of right ankle fracture who failed conservative measures and elected for surgical management.    The risks benefits and alternatives were discussed with the patient preoperatively including but not limited to the risks of infection, bleeding, nerve injury, cardiopulmonary complications, the need for revision surgery, among others, and the patient was willing to proceed.  OPERATIVE IMPLANTS: stryker plate and tight rope  OPERATIVE FINDINGS: Unstable ankle fracture. Stable syndesmosis post op  BLOOD LOSS: min  COMPLICATIONS: none  TOURNIQUET TIME:  OPERATIVE PROCEDURE:  Patient was identified in the preoperative holding area and site was marked by me He was transported to the operating theater and placed on the table in supine position taking care to pad all bony prominences. After a preincinduction time out anesthesia was induced. The right lower extremity was prepped and draped in normal sterile fashion and a pre-incision timeout was performed. Leonard Gutierrez received ancef for preoperative antibiotics.   I made a lateral incision of roughly 7 cm dissection was carried down sharply to the distal fibula and then spreading dissection was used proximally to protect the superficial peroneal nerve. I sharply incised the periosteum and took care to protect the peroneal tendons. I then debrided the fracture site and performed a reduction maneuver which was held in place with a clamp.   I placed  a lag screw across the fracture  I then selected a 7-hole one third tubular plate and placed in a neutralization fashion care was taken distally so as not to penetrate the joint with the cancellus screws.  I then stressed the syndesmosis and it was u nstable for syndesmotic fixation I performed a reduction maneuver with a clamp and placed a tight rope  I assesed the posterior mal piece and it was small enough to not require fixation as it involved less than 20% of the articular surface  The wound was then thoroughly irrigated and closed using a 0 Vicryl and absorbable Monocryl sutures. He was placed in a short leg splint.   POST OPERATIVE PLAN: Non-weightbearing. DVT prophylaxis will consist of mobilization and chemical px

## 2020-06-23 NOTE — Transfer of Care (Signed)
Immediate Anesthesia Transfer of Care Note  Patient: Leonard Gutierrez  Procedure(s) Performed: OPEN REDUCTION INTERNAL FIXATION (ORIF) ANKLE FRACTURE (Right Ankle)  Patient Location: PACU  Anesthesia Type:GA combined with regional for post-op pain  Level of Consciousness: awake, alert  and oriented  Airway & Oxygen Therapy: Patient Spontanous Breathing and Patient connected to face mask oxygen  Post-op Assessment: Report given to RN and Post -op Vital signs reviewed and stable  Post vital signs: Reviewed and stable  Last Vitals:  Vitals Value Taken Time  BP 117/80 06/23/20 1526  Temp    Pulse 92 06/23/20 1528  Resp 14 06/23/20 1528  SpO2 100 % 06/23/20 1528  Vitals shown include unvalidated device data.  Last Pain:  Vitals:   06/23/20 1258  TempSrc: Oral  PainSc: 0-No pain      Patients Stated Pain Goal: 3 (06/23/20 1258)  Complications: No complications documented.

## 2020-06-23 NOTE — Interval H&P Note (Signed)
History and Physical Interval Note:  06/23/2020 12:50 PM  Leonard Gutierrez  has presented today for surgery, with the diagnosis of right ankle fracture.  The various methods of treatment have been discussed with the patient and family. After consideration of risks, benefits and other options for treatment, the patient has consented to  Procedure(s): OPEN REDUCTION INTERNAL FIXATION (ORIF) ANKLE FRACTURE (Right) as a surgical intervention.  The patient's history has been reviewed, patient examined, no change in status, stable for surgery.  I have reviewed the patient's chart and labs.  Questions were answered to the patient's satisfaction.     Sheral Apley

## 2020-06-23 NOTE — Anesthesia Procedure Notes (Signed)
Anesthesia Regional Block: Adductor canal block   Pre-Anesthetic Checklist: ,, timeout performed, Correct Patient, Correct Site, Correct Laterality, Correct Procedure, Correct Position, site marked, Risks and benefits discussed,  Surgical consent,  Pre-op evaluation,  At surgeon's request and post-op pain management  Laterality: Right  Prep: Dura Prep       Needles:  Injection technique: Single-shot  Needle Type: Echogenic Stimulator Needle     Needle Length: 9cm  Needle Gauge: 20     Additional Needles:   Procedures:,,,, ultrasound used (permanent image in chart),,,,  Narrative:  Start time: 06/23/2020 1:10 PM End time: 06/23/2020 1:16 PM Injection made incrementally with aspirations every 5 mL.  Performed by: Personally  Anesthesiologist: Atilano Median, DO  Additional Notes: Patient identified. Risks/Benefits/Options discussed with patient including but not limited to bleeding, infection, nerve damage, failed block, incomplete pain control. Patient expressed understanding and wished to proceed. All questions were answered. Sterile technique was used throughout the entire procedure. Please see nursing notes for vital signs. Aspirated in 5cc intervals with injection for negative confirmation. Patient was given instructions on fall risk and not to get out of bed. All questions and concerns addressed with instructions to call with any issues or inadequate analgesia.

## 2020-06-23 NOTE — Progress Notes (Signed)
Assisted Dr. Nance Pew with right, ultrasound guided, popliteal, adductor canal block. Side rails up, monitors on throughout procedure. See vital signs in flow sheet. Tolerated Procedure well.

## 2020-06-23 NOTE — Anesthesia Procedure Notes (Signed)
Procedure Name: LMA Insertion Date/Time: 06/23/2020 2:17 PM Performed by: Lauralyn Primes, CRNA Pre-anesthesia Checklist: Patient identified, Emergency Drugs available, Suction available and Patient being monitored Patient Re-evaluated:Patient Re-evaluated prior to induction Oxygen Delivery Method: Circle system utilized Preoxygenation: Pre-oxygenation with 100% oxygen Induction Type: IV induction Ventilation: Mask ventilation without difficulty LMA: LMA inserted LMA Size: 5.0 Number of attempts: 1 Airway Equipment and Method: Bite block Placement Confirmation: positive ETCO2 Tube secured with: Tape Dental Injury: Teeth and Oropharynx as per pre-operative assessment

## 2020-06-27 ENCOUNTER — Encounter (HOSPITAL_BASED_OUTPATIENT_CLINIC_OR_DEPARTMENT_OTHER): Payer: Self-pay | Admitting: Orthopedic Surgery
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
